# Patient Record
Sex: Male | Born: 1965 | Race: White | Hispanic: No | Marital: Married | Smoking: Former smoker
Health system: Southern US, Community
[De-identification: ages and names within clinical notes are randomized; demographics above are authoritative.]

## PROBLEM LIST (undated history)

## (undated) DIAGNOSIS — E785 Hyperlipidemia, unspecified: Secondary | ICD-10-CM

## (undated) DIAGNOSIS — I10 Essential (primary) hypertension: Secondary | ICD-10-CM

## (undated) DIAGNOSIS — K649 Unspecified hemorrhoids: Secondary | ICD-10-CM

## (undated) DIAGNOSIS — F419 Anxiety disorder, unspecified: Secondary | ICD-10-CM

## (undated) DIAGNOSIS — B49 Unspecified mycosis: Secondary | ICD-10-CM

## (undated) HISTORY — PX: LUNG SURGERY: SHX703

## (undated) HISTORY — DX: Essential (primary) hypertension: I10

## (undated) HISTORY — DX: Anxiety disorder, unspecified: F41.9

## (undated) HISTORY — DX: Unspecified hemorrhoids: K64.9

## (undated) HISTORY — DX: Hyperlipidemia, unspecified: E78.5

---

## 1998-11-01 ENCOUNTER — Emergency Department (HOSPITAL_COMMUNITY): Admission: EM | Admit: 1998-11-01 | Discharge: 1998-11-01 | Payer: Self-pay | Admitting: Emergency Medicine

## 1998-11-01 ENCOUNTER — Encounter: Payer: Self-pay | Admitting: Family Medicine

## 1998-11-01 ENCOUNTER — Ambulatory Visit (HOSPITAL_COMMUNITY): Admission: RE | Admit: 1998-11-01 | Discharge: 1998-11-01 | Payer: Self-pay | Admitting: Family Medicine

## 1998-11-20 ENCOUNTER — Ambulatory Visit (HOSPITAL_COMMUNITY): Admission: RE | Admit: 1998-11-20 | Discharge: 1998-11-20 | Payer: Self-pay | Admitting: Internal Medicine

## 1998-11-27 ENCOUNTER — Ambulatory Visit: Admission: RE | Admit: 1998-11-27 | Discharge: 1998-11-27 | Payer: Self-pay | Admitting: Internal Medicine

## 1998-12-27 ENCOUNTER — Ambulatory Visit (HOSPITAL_COMMUNITY): Admission: RE | Admit: 1998-12-27 | Discharge: 1998-12-27 | Payer: Self-pay | Admitting: Internal Medicine

## 1999-01-23 ENCOUNTER — Encounter: Payer: Self-pay | Admitting: Thoracic Surgery

## 1999-01-24 ENCOUNTER — Inpatient Hospital Stay (HOSPITAL_COMMUNITY): Admission: RE | Admit: 1999-01-24 | Discharge: 1999-01-28 | Payer: Self-pay | Admitting: Thoracic Surgery

## 1999-01-24 ENCOUNTER — Encounter: Payer: Self-pay | Admitting: Thoracic Surgery

## 1999-01-25 ENCOUNTER — Encounter: Payer: Self-pay | Admitting: Thoracic Surgery

## 1999-01-26 ENCOUNTER — Encounter: Payer: Self-pay | Admitting: Thoracic Surgery

## 1999-01-27 ENCOUNTER — Encounter: Payer: Self-pay | Admitting: Thoracic Surgery

## 2013-03-24 ENCOUNTER — Emergency Department (HOSPITAL_BASED_OUTPATIENT_CLINIC_OR_DEPARTMENT_OTHER)
Admission: EM | Admit: 2013-03-24 | Discharge: 2013-03-24 | Disposition: A | Discharge status: Home / Self Care | Payer: PRIVATE HEALTH INSURANCE | Attending: Emergency Medicine | Admitting: Emergency Medicine

## 2013-03-24 ENCOUNTER — Encounter (HOSPITAL_BASED_OUTPATIENT_CLINIC_OR_DEPARTMENT_OTHER): Payer: Self-pay | Admitting: *Deleted

## 2013-03-24 DIAGNOSIS — R42 Dizziness and giddiness: Secondary | ICD-10-CM | POA: Insufficient documentation

## 2013-03-24 LAB — CBC WITH DIFFERENTIAL/PLATELET
Basophils Absolute: 0.1 10*3/uL (ref 0.0–0.1)
Basophils Relative: 1 % (ref 0–1)
Eosinophils Absolute: 0.1 10*3/uL (ref 0.0–0.7)
Eosinophils Relative: 2 % (ref 0–5)
HCT: 43.5 % (ref 39.0–52.0)
Hemoglobin: 15.5 g/dL (ref 13.0–17.0)
Lymphocytes Relative: 43 % (ref 12–46)
Lymphs Abs: 2.9 10*3/uL (ref 0.7–4.0)
MCH: 28.7 pg (ref 26.0–34.0)
MCHC: 35.6 g/dL (ref 30.0–36.0)
MCV: 80.4 fL (ref 78.0–100.0)
Monocytes Absolute: 0.6 10*3/uL (ref 0.1–1.0)
Monocytes Relative: 9 % (ref 3–12)
Neutro Abs: 3.1 10*3/uL (ref 1.7–7.7)
Neutrophils Relative %: 46 % (ref 43–77)
Platelets: 208 10*3/uL (ref 150–400)
RBC: 5.41 MIL/uL (ref 4.22–5.81)
RDW: 13.6 % (ref 11.5–15.5)
WBC: 6.8 10*3/uL (ref 4.0–10.5)

## 2013-03-24 LAB — BASIC METABOLIC PANEL
BUN: 16 mg/dL (ref 6–23)
CO2: 28 mEq/L (ref 19–32)
Calcium: 9.5 mg/dL (ref 8.4–10.5)
Chloride: 100 mEq/L (ref 96–112)
Creatinine, Ser: 1.1 mg/dL (ref 0.50–1.35)
GFR calc Af Amer: >90 mL/min (ref >90)
GFR calc non Af Amer: 79 mL/min — ABNORMAL LOW (ref >90)
Glucose, Bld: 114 mg/dL — ABNORMAL HIGH (ref 70–99)
Potassium: 3.7 mEq/L (ref 3.5–5.1)
Sodium: 138 mEq/L (ref 135–145)

## 2013-03-24 LAB — TROPONIN I: Troponin I: <0.3 ng/mL (ref <0.30)

## 2013-03-24 NOTE — ED Notes (Signed)
Dizziness. Near syncope. States his face got red. Saw the nurse at work. His BP was 140. His blood sugar was 80. Same thing happened at lunch. The nurse told him to come here to be checked.

## 2013-03-24 NOTE — ED Notes (Signed)
D/c home with ride- emergency sx discussed with pt, as well as when to return to ed

## 2013-03-25 ENCOUNTER — Encounter (HOSPITAL_COMMUNITY): Payer: Self-pay | Admitting: Emergency Medicine

## 2013-03-25 ENCOUNTER — Emergency Department (HOSPITAL_COMMUNITY)
Admission: EM | Admit: 2013-03-25 | Discharge: 2013-03-26 | Disposition: A | Discharge status: Home / Self Care | Payer: PRIVATE HEALTH INSURANCE | Attending: Emergency Medicine | Admitting: Emergency Medicine

## 2013-03-25 DIAGNOSIS — R42 Dizziness and giddiness: Secondary | ICD-10-CM | POA: Insufficient documentation

## 2013-03-25 HISTORY — DX: Unspecified mycosis: B49

## 2013-03-25 NOTE — ED Provider Notes (Signed)
History    CSN: [id number] Arrival date & time 03/24/13  1706  First MD Initiated Contact with Patient 03/24/13 1742     Chief Complaint  Patient presents with  . Dizziness   (Consider location/radiation/quality/duration/timing/severity/associated sxs/prior Treatment) HPI  Jay Mueller is a 47 year old man who presents today complaining that he felt weak and lightheaded after lunch today. He did normal lunch was salmon and salad. He then began feeling somewhat lightheaded. This episode occurred approximately 1:30 and lasted for about 20 minutes. He had a second episode later in the afternoon. The nurse at his work checked his blood sugar and blood pressure. He is advised to come in for further evaluation. He denies any head pain, chest pain, or dyspnea. He has had no similar episodes in the past. He has not had any nausea, vomiting, or diarrhea. History reviewed. No pertinent past medical history. Past Surgical History  Procedure Laterality Date  . Lung surgery     No family history on file. History  Substance Use Topics  . Smoking status: Never Smoker   . Smokeless tobacco: Not on file  . Alcohol Use: Yes    Review of Systems  All other systems reviewed and are negative.    Allergies  Review of patient's allergies indicates no known allergies.  Home Medications  No current outpatient prescriptions on file. BP 146/96  Pulse 58  Temp(Src) 98.3 F (36.8 C) (Oral)  Resp 18  Ht 5\' 8"  (1.727 m)  Wt 236 lb (107.049 kg)  BMI 35.89 kg/m2  SpO2 97% Physical Exam  Nursing note and vitals reviewed. Constitutional: He is oriented to person, place, and time. He appears well-developed and well-nourished.  HENT:  Head: Normocephalic and atraumatic.  Right Ear: External ear normal.  Left Ear: External ear normal.  Nose: Nose normal.  Mouth/Throat: Oropharynx is clear and moist.  Eyes: Conjunctivae and EOM are normal. Pupils are equal, round, and reactive to light.  Neck:  Normal range of motion. Neck supple.  Cardiovascular: Normal rate, regular rhythm, normal heart sounds and intact distal pulses.   Pulmonary/Chest: Effort normal and breath sounds normal. No respiratory distress. He has no wheezes. He exhibits no tenderness.  Abdominal: Soft. Bowel sounds are normal. He exhibits no distension and no mass. There is no tenderness. There is no guarding.  Musculoskeletal: Normal range of motion.  Neurological: He is alert and oriented to person, place, and time. He has normal reflexes. He exhibits normal muscle tone. Coordination normal.  Skin: Skin is warm and dry.  Psychiatric: He has a normal Mueller and affect. His behavior is normal. Judgment and thought content normal.    ED Course  Procedures (including critical care time) Labs Reviewed  BASIC METABOLIC PANEL - Abnormal; Notable for the following:    Glucose, Bld 114 (*)    GFR calc non Af Amer 79 (*)    All other components within normal limits  CBC WITH DIFFERENTIAL  TROPONIN I   No results found. 1. Light headed     Date: 03/25/2013  Rate: 54  Rhythm: normal sinus rhythm  QRS Axis: left  Intervals: normal  ST/T Wave abnormalities: nonspecific ST/T changes  Conduction Disutrbances:none and left anterior fascicular block  Narrative Interpretation:   Old EKG Reviewed: none available Results for orders placed during the hospital encounter of 03/24/13  CBC WITH DIFFERENTIAL      Result Value Range   WBC 6.8  4.0 - 10.5 K/uL   RBC 5.41  4.22 -  5.81 MIL/uL   Hemoglobin 15.5  13.0 - 17.0 g/dL   HCT 19.1  47.8 - 29.5 %   MCV 80.4  78.0 - 100.0 fL   MCH 28.7  26.0 - 34.0 pg   MCHC 35.6  30.0 - 36.0 g/dL   RDW 62.1  30.8 - 65.7 %   Platelets 208  150 - 400 K/uL   Neutrophils Relative % 46  43 - 77 %   Neutro Abs 3.1  1.7 - 7.7 K/uL   Lymphocytes Relative 43  12 - 46 %   Lymphs Abs 2.9  0.7 - 4.0 K/uL   Monocytes Relative 9  3 - 12 %   Monocytes Absolute 0.6  0.1 - 1.0 K/uL   Eosinophils  Relative 2  0 - 5 %   Eosinophils Absolute 0.1  0.0 - 0.7 K/uL   Basophils Relative 1  0 - 1 %   Basophils Absolute 0.1  0.0 - 0.1 K/uL  BASIC METABOLIC PANEL      Result Value Range   Sodium 138  135 - 145 mEq/L   Potassium 3.7  3.5 - 5.1 mEq/L   Chloride 100  96 - 112 mEq/L   CO2 28  19 - 32 mEq/L   Glucose, Bld 114 (*) 70 - 99 mg/dL   BUN 16  6 - 23 mg/dL   Creatinine, Ser 8.46  0.50 - 1.35 mg/dL   Calcium 9.5  8.4 - 96.2 mg/dL   GFR calc non Af Amer 79 (*) >90 mL/min   GFR calc Af Amer >90  >90 mL/min  TROPONIN I      Result Value Range   Troponin I <0.30  <0.30 ng/mL     MDM  Patient presents with some intermittent episodes of lightheadedness. He has had no symptoms here. He denies any chest pain or other symptoms. 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 He is advised have close followup and return if he is worse at any time.  Hilario Quarry, MD 03/25/13 2229

## 2013-03-25 NOTE — ED Notes (Addendum)
Pt states that he was at [facility] last night and they DC'd him. Felt better today until about 1900 when he began to get shaky and diaphoretic and red in the face. Pt tried to rest and felt better and the symptoms returned around 2245. Pt in NAD at this time. Pt denies any blurred vision.

## 2013-03-26 ENCOUNTER — Emergency Department (HOSPITAL_COMMUNITY): Payer: PRIVATE HEALTH INSURANCE

## 2013-03-26 ENCOUNTER — Other Ambulatory Visit: Payer: Self-pay

## 2013-03-26 LAB — URINALYSIS, ROUTINE W REFLEX MICROSCOPIC
Hgb urine dipstick: NEGATIVE
Nitrite: NEGATIVE
Specific Gravity, Urine: 1.019 (ref 1.005–1.030)
Urobilinogen, UA: 0.2 mg/dL (ref 0.0–1.0)

## 2013-03-26 LAB — CBC WITH DIFFERENTIAL/PLATELET
Eosinophils Absolute: 0.1 10*3/uL (ref 0.0–0.7)
Lymphocytes Relative: 27 % (ref 12–46)
Lymphs Abs: 2.2 10*3/uL (ref 0.7–4.0)
MCH: 27.9 pg (ref 26.0–34.0)
Neutro Abs: 5.2 10*3/uL (ref 1.7–7.7)
Neutrophils Relative %: 66 % (ref 43–77)
Platelets: 202 10*3/uL (ref 150–400)
RBC: 5.44 MIL/uL (ref 4.22–5.81)
WBC: 8 10*3/uL (ref 4.0–10.5)

## 2013-03-26 LAB — BASIC METABOLIC PANEL
GFR calc non Af Amer: 82 mL/min — ABNORMAL LOW (ref >90)
Glucose, Bld: 120 mg/dL — ABNORMAL HIGH (ref 70–99)
Potassium: 3.9 mEq/L (ref 3.5–5.1)
Sodium: 136 mEq/L (ref 135–145)

## 2013-03-26 LAB — GLUCOSE, CAPILLARY: Glucose-Capillary: 103 mg/dL — ABNORMAL HIGH (ref 70–99)

## 2013-03-26 LAB — POCT I-STAT TROPONIN I: Troponin i, poc: 0.01 ng/mL (ref 0.00–0.08)

## 2013-03-26 MED ORDER — SODIUM CHLORIDE 0.9 % IV BOLUS (SEPSIS): 1000.0000 mL | Freq: Once | INTRAVENOUS | Status: DC

## 2013-03-26 MED ORDER — HYDROXYZINE HCL 25 MG PO TABS: 25.0000 mg | ORAL_TABLET | ORAL | Status: DC | PRN

## 2013-03-26 MED ORDER — LORAZEPAM 2 MG/ML IJ SOLN
0.5000 mg | Freq: Once | INTRAMUSCULAR | Status: AC
  Administered 2013-03-26: 0.5 mg via INTRAVENOUS
  Filled 2013-03-26: qty 1

## 2013-03-26 MED ORDER — SODIUM CHLORIDE 0.9 % IV BOLUS (SEPSIS)
1000.0000 mL | Freq: Once | INTRAVENOUS | Status: AC
  Administered 2013-03-26: 1000 mL via INTRAVENOUS

## 2013-03-26 MED ORDER — LORAZEPAM 2 MG/ML IJ SOLN
0.5000 mg | Freq: Once | INTRAMUSCULAR | Status: AC
Start: 2013-03-26 — End: 2013-03-26
  Administered 2013-03-26: 0.5 mg via INTRAVENOUS
  Filled 2013-03-26: qty 1

## 2013-03-26 NOTE — ED Provider Notes (Signed)
History    CSN: [id number] Arrival date & time 03/25/13  2326  First MD Initiated Contact with Patient 03/26/13 0034     Chief Complaint  Patient presents with  . Shaking  . Dizziness   (Consider location/radiation/quality/duration/timing/severity/associated sxs/prior Treatment) HPI  Jay Mueller is a 47 y.o. male complaining of shaking and sensation dizziness lightheadedness, denies vertiginous symptoms. He also feels flushed in his face and warm. He denies chills nausea vomiting chest pain shortness of breath, change in his vision, abdominal pain, change in bowel or bladder habits. Past medical history significant for left lobectomy secondary to fungal pneumonia. Patient states that she is extremely stressed and agitated about both family and work problems. No past medical history of panic or anxiety disorder. Patient states that when he starts thinking about his jitteriness the jitteriness gets worse.  Past Medical History  Diagnosis Date  . 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 Lung surgery      Left Lobectomy   History reviewed. No pertinent family history. History  Substance Use Topics  . Smoking status: Never Smoker   . Smokeless tobacco: Never Used  . Alcohol Use: Yes     Comment: occassionally    Review of Systems  Constitutional:       Negative except as described in HPI  HENT:       Negative except as described in HPI  Respiratory:       Negative except as described in HPI  Cardiovascular:       Negative except as described in HPI  Gastrointestinal:       Negative except as described in HPI  Genitourinary:       Negative except as described in HPI  Musculoskeletal:       Negative except as described in HPI  Skin:       Negative except as described in HPI  Neurological:       Negative except as described in HPI  All other systems reviewed and are negative.    Allergies  Review of patient's  allergies indicates no known allergies.  Home Medications   Current Outpatient Rx  Name  Route  Sig  Dispense  Refill  . ibuprofen (ADVIL,MOTRIN) 200 MG tablet   Oral   Take 200 mg by mouth every 6 (six) hours as needed for pain.          BP 162/104  Pulse 61  Temp(Src) 99.6 F (37.6 C) (Rectal)  Ht 5\' 9"  (1.753 m)  Wt 232 lb 4 oz (105.348 kg)  BMI 34.28 kg/m2  SpO2 98% Physical Exam  Nursing note and vitals reviewed. Constitutional: He is oriented to person, place, and time. He appears well-developed and well-nourished. No distress.  HENT:  Head: Normocephalic and atraumatic.  Mouth/Throat: Oropharynx is clear and moist.  Eyes: Conjunctivae and EOM are normal. Pupils are equal, round, and reactive to light.  Neck: Normal range of motion. Neck supple.  Patient has full range of motion to neck. No tenderness to deep palpation of the posterior cervical spine. Patient can flex chin to chest with no pain.  Cardiovascular: Normal rate, regular rhythm and intact distal pulses.   Pulmonary/Chest: Effort normal and breath sounds normal. No stridor. No respiratory distress. He has no wheezes. He has no rales. He exhibits no tenderness.  Abdominal: Soft. Bowel sounds are normal. He exhibits no distension and no mass. There is  no tenderness. There is no rebound and no guarding.  Musculoskeletal: Normal range of motion. He exhibits no edema.  Neurological: He is alert and oriented to person, place, and time.  Psychiatric: He has a normal mood and affect.    ED Course  Procedures (including critical care time) Labs Reviewed  BASIC METABOLIC PANEL - Abnormal; Notable for the following:    Glucose, Bld 120 (*)    GFR calc non Af Amer 82 (*)    All other components within normal limits  GLUCOSE, CAPILLARY - Abnormal; Notable for the following:    Glucose-Capillary 103 (*)    All other components within normal limits  CBC WITH DIFFERENTIAL  URINALYSIS, ROUTINE W REFLEX MICROSCOPIC   POCT I-STAT TROPONIN I   Dg Chest 2 View  03/26/2013   *RADIOLOGY REPORT*  Clinical Data: Fever, shakes, shortness of breath.  CHEST - 2 VIEW  Comparison: None.  Findings: Mild hyperinflation suggesting emphysema. The heart size and pulmonary vascularity are normal. The lungs appear clear and expanded without focal air space disease or consolidation. No blunting of the costophrenic angles.  No pneumothorax.  Mediastinal contours appear intact.  Degenerative changes in the spine.  IMPRESSION: No evidence of active pulmonary disease.   Original Report Authenticated By: Burman Nieves, M.D.    Date: 03/26/2013  Rate: 59  Rhythm: normal sinus rhythm  QRS Axis: left  Intervals: normal  ST/T Wave abnormalities: normal  Conduction Disutrbances:nonspecific intraventricular conduction delay  Narrative Interpretation:   Old EKG Reviewed: unchanged  1. Dizziness     MDM   Filed Vitals:   03/26/13 0110 03/26/13 0112 03/26/13 0214 03/26/13 0300  BP: 192/110 162/104  127/89  Pulse: 60 61  61  Temp:   99.6 F (37.6 C)   TempSrc:   Rectal   Resp:    17  Height:      Weight:      SpO2:    95%     Jay Mueller is a 47 y.o. male with tremor, sensation of feeling flushed and sweating worsening over 24 hours. Chest x-ray shows no abnormalities, blood work and vitals are normal. EKG is nonischemic and troponin (this is a second troponin is patient was seen earlier in the day) is negative. I have reassured the patient, he has improved after Ativan is given. Advised him to follow with primary care for thyroid workup.  Medications  sodium chloride 0.9 % bolus 1,000 mL (0 mLs Intravenous Stopped 03/26/13 0323)  LORazepam (ATIVAN) injection 0.5 mg (0.5 mg Intravenous Given 03/26/13 0222)  LORazepam (ATIVAN) injection 0.5 mg (0.5 mg Intravenous Given 03/26/13 0319)    Pt is hemodynamically stable, appropriate for, and amenable to discharge at this time. Pt verbalized understanding and agrees with care plan.  Outpatient follow-up and specific return precautions discussed.    Discharge Medication List as of 03/26/2013  3:03 AM    START taking these medications   Details  hydrOXYzine (ATARAX/VISTARIL) 25 MG tablet Take 1 tablet (25 mg total) by mouth every 4 (four) hours as needed for anxiety., Starting 03/26/2013, Until Discontinued, [employer], PA-C 03/27/13 1747

## 2013-03-26 NOTE — ED Notes (Signed)
Patient transported to X-ray 

## 2013-03-26 NOTE — ED Notes (Signed)
Pt c/o dizziness and feeling wobbly and these symptoms started on Thursday. Yesterday pt began shaking and that lasted for 45 minutes. Pt says he feels anxious at this time and shaky.

## 2013-03-30 NOTE — ED Provider Notes (Signed)
Medical screening examination/treatment/procedure(s) were performed by non-physician practitioner and as supervising physician I was immediately available for consultation/collaboration.  Jasmine Awe, MD 03/30/13 2325

## 2013-06-27 ENCOUNTER — Encounter: Payer: Self-pay | Admitting: Gastroenterology

## 2013-07-25 ENCOUNTER — Ambulatory Visit (INDEPENDENT_AMBULATORY_CARE_PROVIDER_SITE_OTHER): Payer: PRIVATE HEALTH INSURANCE | Admitting: Gastroenterology

## 2013-07-25 ENCOUNTER — Encounter: Payer: Self-pay | Admitting: Gastroenterology

## 2013-07-25 VITALS — BP 148/98 | HR 68 | Ht 69.29 in | Wt 228.0 lb

## 2013-07-25 DIAGNOSIS — K648 Other hemorrhoids: Secondary | ICD-10-CM | POA: Insufficient documentation

## 2013-07-25 MED ORDER — HYDROCORTISONE ACETATE 25 MG RE SUPP: 25.0000 mg | Freq: Two times a day (BID) | RECTAL | Status: AC

## 2013-07-25 NOTE — Assessment & Plan Note (Signed)
The patient has grade 2 hemorrhoids with persistent symptoms of itching and difficulty with hygiene  Recommendations #1 Anusol a.c. Suppositories #2 band ligation

## 2013-07-25 NOTE — Progress Notes (Signed)
History of Present Illness:  Pleasant 47 year old male referred for evaluation of rectal discomfort and bleeding.  Approximately 6 weeks ago he developed rectal discomfort and noted external hemorrhoids.  Since that time he's had intermittent minimal rectal bleeding consisting of bright  red blood on the toilet tissue.  He's had rectal itching.  With every bowel movement he notes prolapse of hemorrhoids that reduced spontaneously.  He moves his bowels regularly.  He is without abdominal pain.    Past Medical History  Diagnosis Date  . 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 Hemorrhoids   . HLD (hyperlipidemia)   . HTN (hypertension)   . Anxiety    Past Surgical History  Procedure Laterality Date  . Lung surgery Left     Left Lobectomy due to fungus   family history is not on file. He was adopted. Current Outpatient Prescriptions  Medication Sig Dispense Refill  . ibuprofen (ADVIL,MOTRIN) 200 MG tablet Take 200 mg by mouth every 6 (six) hours as needed for pain.       No current facility-administered medications for this visit.   Allergies as of 07/25/2013  . (No Known Allergies)    reports that he quit smoking about 17 years ago. His smoking use included Cigarettes. He smoked 0.00 packs per day. He has never used smokeless tobacco. He reports that he drinks alcohol. He reports that he does not use illicit drugs.     Review of Systems: Pertinent positive and negative review of systems were noted in the above HPI section. All other review of systems were otherwise negative.  Vital signs were reviewed in today's medical record Physical Exam: General: Well developed , well nourished, no acute distress Skin: anicteric Head: Normocephalic and atraumatic Eyes:  sclerae anicteric, EOMI Ears: Normal auditory acuity Mouth: No deformity or lesions Neck: Supple, no masses or thyromegaly Lungs: Clear throughout to auscultation Heart: Regular rate and rhythm; no murmurs, rubs or  bruits Abdomen: Soft, non tender and non distended. No masses, hepatosplenomegaly or hernias noted. Normal Bowel sounds Rectal: There are no external lesions Musculoskeletal: Symmetrical with no gross deformities  Skin: No lesions on visible extremities Pulses:  Normal pulses noted Extremities: No clubbing, cyanosis, edema or deformities noted Neurological: Alert oriented x 4, grossly nonfocal Cervical Nodes:  No significant cervical adenopathy Inguinal Nodes: No significant inguinal adenopathy Psychological:  Alert and cooperative. Normal mood and affect

## 2013-07-25 NOTE — Patient Instructions (Signed)
Your hemorrhoidal banding is scheduled on 08/10/2013 at 1:30pm

## 2013-07-28 ENCOUNTER — Other Ambulatory Visit: Payer: Self-pay

## 2013-08-10 ENCOUNTER — Encounter: Payer: PRIVATE HEALTH INSURANCE | Admitting: Gastroenterology

## 2016-07-31 ENCOUNTER — Ambulatory Visit: Payer: Self-pay | Admitting: Podiatry

## 2016-08-13 ENCOUNTER — Ambulatory Visit: Payer: Self-pay | Admitting: Podiatry

## 2016-10-01 ENCOUNTER — Ambulatory Visit: Payer: Self-pay | Admitting: Podiatry

## 2017-08-26 DIAGNOSIS — Z1211 Encounter for screening for malignant neoplasm of colon: Secondary | ICD-10-CM | POA: Diagnosis not present

## 2017-08-26 DIAGNOSIS — K219 Gastro-esophageal reflux disease without esophagitis: Secondary | ICD-10-CM | POA: Diagnosis not present

## 2017-10-19 DIAGNOSIS — G4700 Insomnia, unspecified: Secondary | ICD-10-CM | POA: Diagnosis not present

## 2017-10-19 DIAGNOSIS — R51 Headache: Secondary | ICD-10-CM | POA: Diagnosis not present

## 2018-04-01 DIAGNOSIS — Z630 Problems in relationship with spouse or partner: Secondary | ICD-10-CM | POA: Diagnosis not present

## 2018-04-05 DIAGNOSIS — N451 Epididymitis: Secondary | ICD-10-CM | POA: Diagnosis not present

## 2018-04-07 DIAGNOSIS — N451 Epididymitis: Secondary | ICD-10-CM | POA: Diagnosis not present

## 2018-04-07 DIAGNOSIS — R195 Other fecal abnormalities: Secondary | ICD-10-CM | POA: Diagnosis not present

## 2018-04-08 DIAGNOSIS — Z630 Problems in relationship with spouse or partner: Secondary | ICD-10-CM | POA: Diagnosis not present

## 2018-05-03 DIAGNOSIS — R300 Dysuria: Secondary | ICD-10-CM | POA: Diagnosis not present

## 2018-05-03 DIAGNOSIS — R1032 Left lower quadrant pain: Secondary | ICD-10-CM | POA: Diagnosis not present

## 2018-05-05 ENCOUNTER — Other Ambulatory Visit: Payer: Self-pay | Admitting: Adult Health Nurse Practitioner

## 2018-05-05 DIAGNOSIS — R1032 Left lower quadrant pain: Secondary | ICD-10-CM

## 2018-05-07 ENCOUNTER — Ambulatory Visit
Admission: RE | Admit: 2018-05-07 | Discharge: 2018-05-07 | Disposition: A | Discharge status: Home / Self Care | Payer: Commercial Managed Care - PPO | Source: Ambulatory Visit | Attending: Adult Health Nurse Practitioner | Admitting: Adult Health Nurse Practitioner

## 2018-05-07 DIAGNOSIS — R1900 Intra-abdominal and pelvic swelling, mass and lump, unspecified site: Secondary | ICD-10-CM | POA: Diagnosis not present

## 2018-05-07 DIAGNOSIS — R1032 Left lower quadrant pain: Secondary | ICD-10-CM

## 2018-06-22 ENCOUNTER — Ambulatory Visit: Payer: Self-pay | Admitting: Surgery

## 2018-06-22 DIAGNOSIS — K4090 Unilateral inguinal hernia, without obstruction or gangrene, not specified as recurrent: Secondary | ICD-10-CM | POA: Diagnosis not present

## 2018-06-22 NOTE — H&P (Signed)
Tanja Port Documented: 06/22/2018 3:17 PM Location: [facility] Patient #: [id number] DOB: 1966/08/26 Single / Language: [place] / Race: White Male  History of Present Illness Maisie Fus A. Ramiz Turpin MD; 06/22/2018 4:37 PM) Patient words: Patient sent at the request of Dr. Izola Price for a left inguinal hernia. He has had it for a number of months. The patient describes a dull aching pain. Sensation in his left groin that radiates into his testicle. It is made worse out of the urination sexual intercourse. He denies nausea vomiting. He denies dysuria. He notices a bulge in his left groin and this was verified by ultrasound obtained by Dr. Izola Price. He has no pain in his right groin. No change in bowel or bladder function.  The patient is a 52 year old male.   Diagnostic Studies History Maurilio Lovely; 06/22/2018 3:18 PM) Colonoscopy never  Allergies Maurilio Lovely; 06/22/2018 3:18 PM) No Known Drug Allergies [06/22/2018]: Allergies Reconciled  Medication History Maurilio Lovely; 06/22/2018 3:18 PM) Lisinopril (20MG  Tablet, Oral) Active. Medications Reconciled  Other Problems Maurilio Lovely; 06/22/2018 3:18 PM) High blood pressure     Review of Systems Maurilio Lovely; 06/22/2018 3:18 PM) Musculoskeletal Not Present- Back Pain, Joint Pain, Joint Stiffness, Muscle Pain, Muscle Weakness and Swelling of Extremities. Hematology Not Present- Blood Thinners, Easy Bruising, Excessive bleeding, Gland problems, HIV and Persistent Infections.  Vitals Maurilio Lovely; 06/22/2018 3:19 PM) 06/22/2018 3:18 PM Weight: 181.25 lb Height: 69in Body Surface Area: 1.98 m Body Mass Index: 26.77 kg/m  Temp.: 98.63F(Oral)  Pulse: 73 (Regular)  BP: 130/82 (Sitting, Left Arm, Standard)      Physical Exam (Shondale Quinley A. Kattia Selley MD; 06/22/2018 4:38 PM)  General Mental Status-Alert. General Appearance-Consistent with stated age. Hydration-Well hydrated. Voice-Normal.  Head  and Neck Head-normocephalic, atraumatic with no lesions or palpable masses. Trachea-midline.  Chest and Lung Exam Chest and lung exam reveals -quiet, even and easy respiratory effort with no use of accessory muscles and on auscultation, normal breath sounds, no adventitious sounds and normal vocal resonance. Inspection Chest Wall - Normal. Back - normal.  Cardiovascular Cardiovascular examination reveals -normal heart sounds, regular rate and rhythm with no murmurs and normal pedal pulses bilaterally.  Abdomen Note: Reducible left inguinal hernia. No evidence of right inguinal hernia.  Neurologic Neurologic evaluation reveals -alert and oriented x 3 with no impairment of recent or remote memory. Mental Status-Normal.  Musculoskeletal Normal Exam - Left-Upper Extremity Strength Normal and Lower Extremity Strength Normal. Normal Exam - Right-Upper Extremity Strength Normal, Lower Extremity Weakness.    Assessment & Plan (Aislynn Cifelli A. Pavel Gadd MD; 06/22/2018 4:39 PM)  LEFT INGUINAL HERNIA (K40.90) Impression: Discussed laparoscopic and open techniques with mesh. Risks, benefits and long-term expectations discussed. He will think things over and decide on timing depending on his travel work schedule. Symptoms of incarceration were discussed as well. The risk of hernia repair include bleeding, infection, organ injury, bowel injury, bladder injury, nerve injury recurrent hernia, blood clots, worsening of underlying condition, chronic pain, mesh use, open surgery, death, and the need for other operattions. Pt agrees to proceed  Current Plans Pt Education - Pamphlet Given - Hernia Surgery: discussed with patient and provided information. The anatomy & physiology of the abdominal wall and pelvic floor was discussed. The pathophysiology of hernias in the inguinal and pelvic region was discussed. Natural history risks such as progressive enlargement, pain, incarceration, and  strangulation was discussed. Contributors to complications such as smoking, obesity, diabetes, prior surgery, etc were discussed.  I feel the risks  of no intervention will lead to serious problems that outweigh the operative risks; therefore, I recommended surgery to reduce and repair the hernia. I explained laparoscopic techniques with possible need for an open approach. I noted usual use of mesh to patch and/or buttress hernia repair  Risks such as bleeding, infection, abscess, need for further treatment, heart attack, death, and other risks were discussed. I noted a good likelihood this will help address the problem. Goals of post-operative recovery were discussed as well. Possibility that this will not correct all symptoms was explained. I stressed the importance of low-impact activity, aggressive pain control, avoiding constipation, & not pushing through pain to minimize risk of post-operative chronic pain or injury. Possibility of reherniation was discussed. We will work to minimize complications.  An educational handout further explaining the pathology & treatment options was given as well. Questions were answered. The patient expresses understanding & wishes to proceed with surgery.

## 2018-06-22 NOTE — H&P (Signed)
Jay Mueller Documented: 06/22/2018 3:17 PM Location: [facility] Patient #: [id number] DOB: May 02, 1966 Single / Language: Jay Mueller / Race: White Male  History of Present Illness Jay Fus A. Lakela Kuba MD; 06/22/2018 4:37 PM) Patient words: Patient sent at the request of Dr. Izola Price for a left inguinal hernia. He has had it for a number of months. The patient describes a dull aching pain. Sensation in his left groin that radiates into his testicle. It is made worse out of the urination sexual intercourse. He denies nausea vomiting. He denies dysuria. He notices a bulge in his left groin and this was verified by ultrasound obtained by Dr. Izola Price. He has no pain in his right groin. No change in bowel or bladder function.  The patient is a 52 year old male.   Diagnostic Studies History Maurilio Lovely; 06/22/2018 3:18 PM) Colonoscopy never  Allergies Maurilio Lovely; 06/22/2018 3:18 PM) No Known Drug Allergies [06/22/2018]: Allergies Reconciled  Medication History Maurilio Lovely; 06/22/2018 3:18 PM) Lisinopril (20MG  Tablet, Oral) Active. Medications Reconciled  Other Problems Maurilio Lovely; 06/22/2018 3:18 PM) High blood pressure     Review of Systems Maurilio Lovely; 06/22/2018 3:18 PM) Musculoskeletal Not Present- Back Pain, Joint Pain, Joint Stiffness, Muscle Pain, Muscle Weakness and Swelling of Extremities. Hematology Not Present- Blood Thinners, Easy Bruising, Excessive bleeding, Gland problems, HIV and Persistent Infections.  Vitals Maurilio Lovely; 06/22/2018 3:19 PM) 06/22/2018 3:18 PM Weight: 181.25 lb Height: 69in Body Surface Area: 1.98 m Body Mass Index: 26.77 kg/m  Temp.: 98.1F(Oral)  Pulse: 73 (Regular)  BP: 130/82 (Sitting, Left Arm, Standard)      Physical Exam (Jay Mueller A. Pilar Corrales MD; 06/22/2018 4:38 PM)  General Mental Status-Alert. General Appearance-Consistent with stated age. Hydration-Well hydrated. Voice-Normal.  Head  and Neck Head-normocephalic, atraumatic with no lesions or palpable masses. Trachea-midline.  Chest and Lung Exam Chest and lung exam reveals -quiet, even and easy respiratory effort with no use of accessory muscles and on auscultation, normal breath sounds, no adventitious sounds and normal vocal resonance. Inspection Chest Wall - Normal. Back - normal.  Cardiovascular Cardiovascular examination reveals -normal heart sounds, regular rate and rhythm with no murmurs and normal pedal pulses bilaterally.  Abdomen Note: Reducible left inguinal hernia. No evidence of right inguinal hernia.  Neurologic Neurologic evaluation reveals -alert and oriented x 3 with no impairment of recent or remote memory. Mental Status-Normal.  Musculoskeletal Normal Exam - Left-Upper Extremity Strength Normal and Lower Extremity Strength Normal. Normal Exam - Right-Upper Extremity Strength Normal, Lower Extremity Weakness.    Assessment & Plan (Jay Stoney A. Sarahi Borland MD; 06/22/2018 4:41 PM)  LEFT INGUINAL HERNIA (K40.90) Impression: Discussed laparoscopic and open techniques with mesh. Risks, benefits and long-term expectations discussed. He will think things over and decide on timing depending on his travel work schedule. Symptoms of incarceration were discussed as well. The risk of hernia repair include bleeding, infection, organ injury, bowel injury, bladder injury, nerve injury recurrent hernia, blood clots, worsening of underlying condition, chronic pain, mesh use, open surgery, death, and the need for other operattions. Pt agrees to proceed  Current Plans Pt Education - Pamphlet Given - Hernia Surgery: discussed with patient and provided information. The anatomy & physiology of the abdominal wall and pelvic floor was discussed. The pathophysiology of hernias in the inguinal and pelvic region was discussed. Natural history risks such as progressive enlargement, pain, incarceration, and  strangulation was discussed. Contributors to complications such as smoking, obesity, diabetes, prior surgery, etc were discussed.  I feel the risks  of no intervention will lead to serious problems that outweigh the operative risks; therefore, I recommended surgery to reduce and repair the hernia. I explained laparoscopic techniques with possible need for an open approach. I noted usual use of mesh to patch and/or buttress hernia repair  Risks such as bleeding, infection, abscess, need for further treatment, heart attack, death, and other risks were discussed. I noted a good likelihood this will help address the problem. Goals of post-operative recovery were discussed as well. Possibility that this will not correct all symptoms was explained. I stressed the importance of low-impact activity, aggressive pain control, avoiding constipation, & not pushing through pain to minimize risk of post-operative chronic pain or injury. Possibility of reherniation was discussed. We will work to minimize complications.  An educational handout further explaining the pathology & treatment options was given as well. Questions were answered. The patient expresses understanding & wishes to proceed with surgery.  Pt Education - [facility] Mesh education: discussed with patient and provided information.

## 2019-08-02 IMAGING — US US PELVIS LIMITED
1 series · 11 of 11 positions shown · non-contrast
Comparison: None.

CLINICAL DATA: Left groin swelling and tenderness for 2 months.

EXAM:
LIMITED ULTRASOUND OF PELVIS
TECHNIQUE: Limited transabdominal ultrasound examination of the pelvis was
performed.

[Series 1: us pelvis limited · 0.09mm/px · 11 acquisitions, 11 frames shown]
[im 1/11]
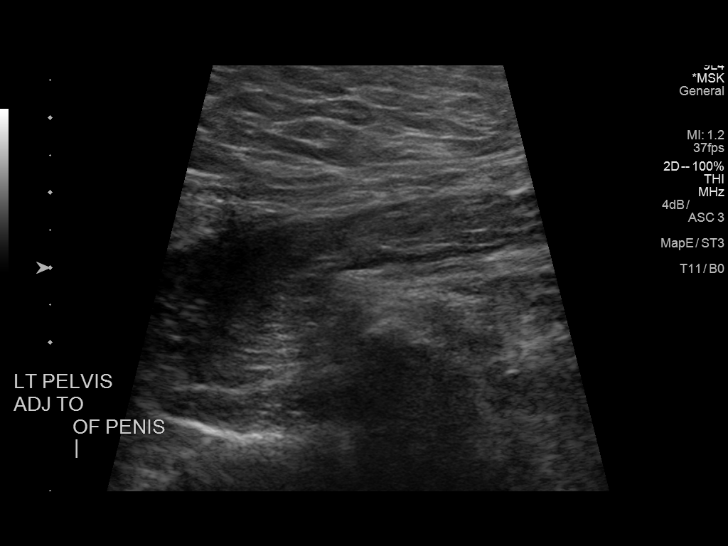
[im 2/11]
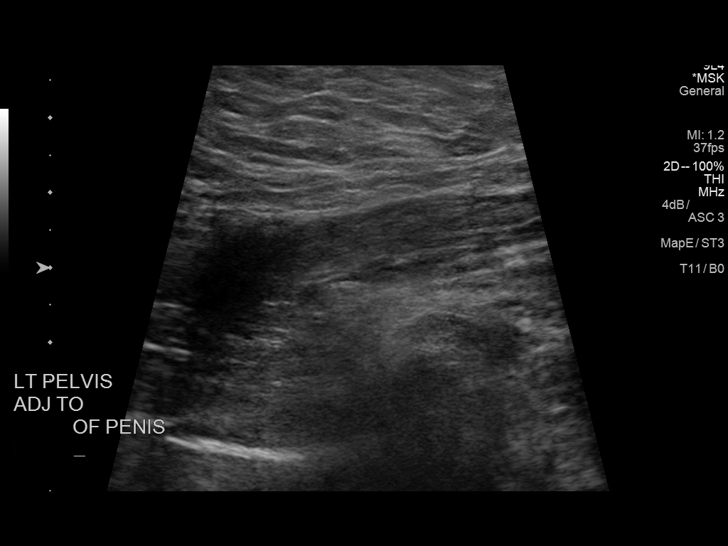
[im 3/11]
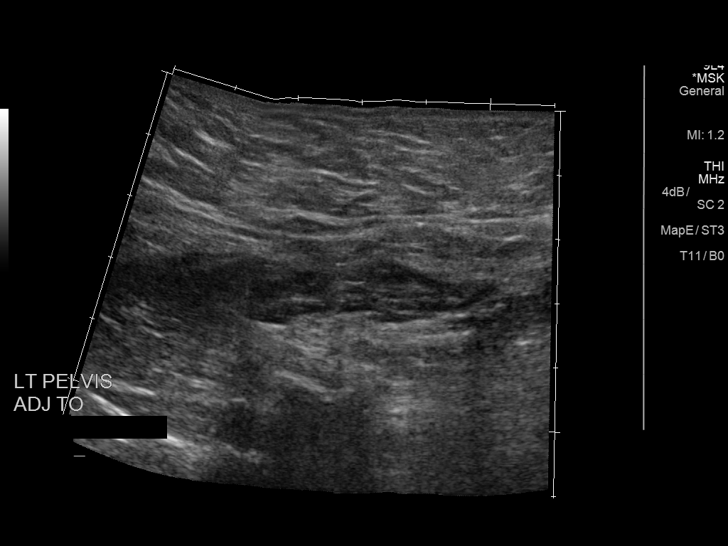
[im 4/11]
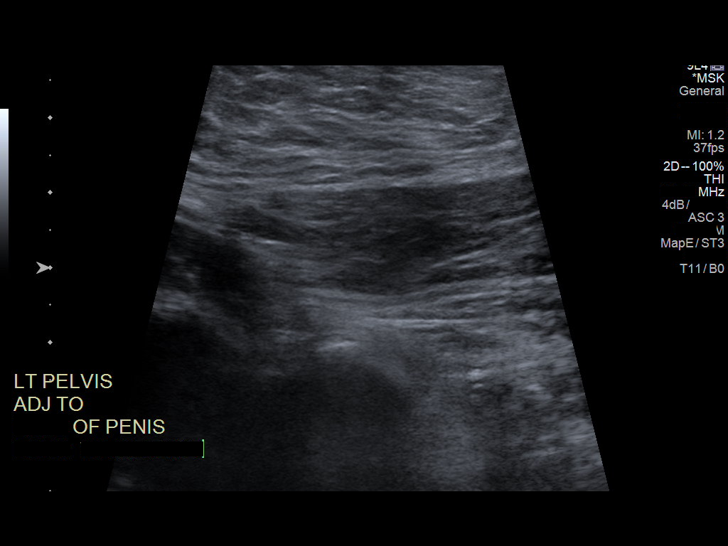
[im 5/11]
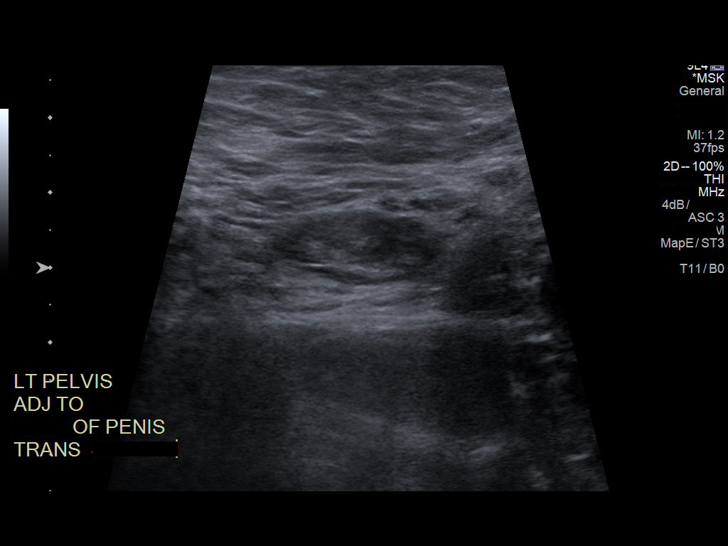
[im 6/11]
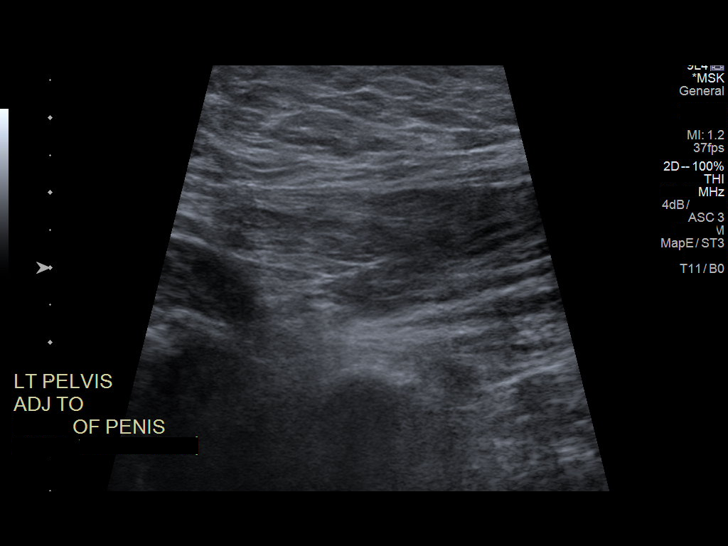
[im 7/11]
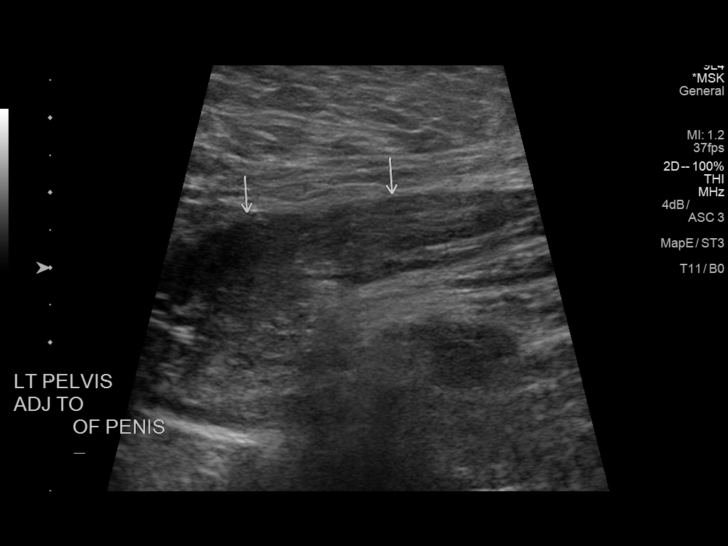
[im 8/11]
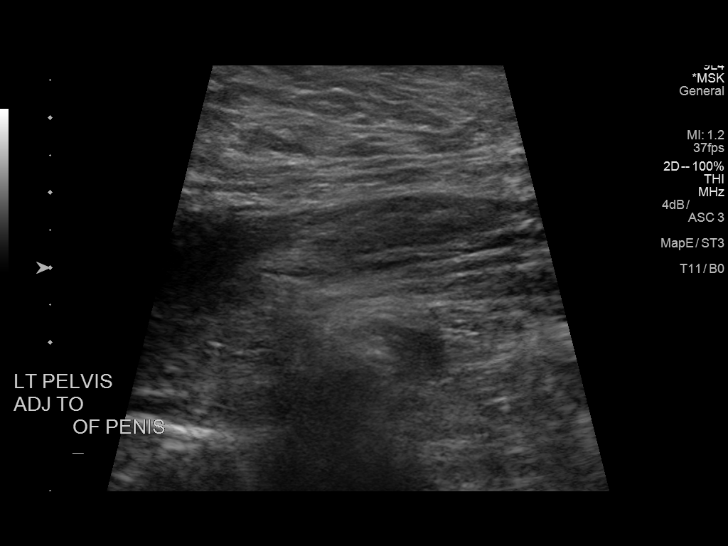
[im 9/11]
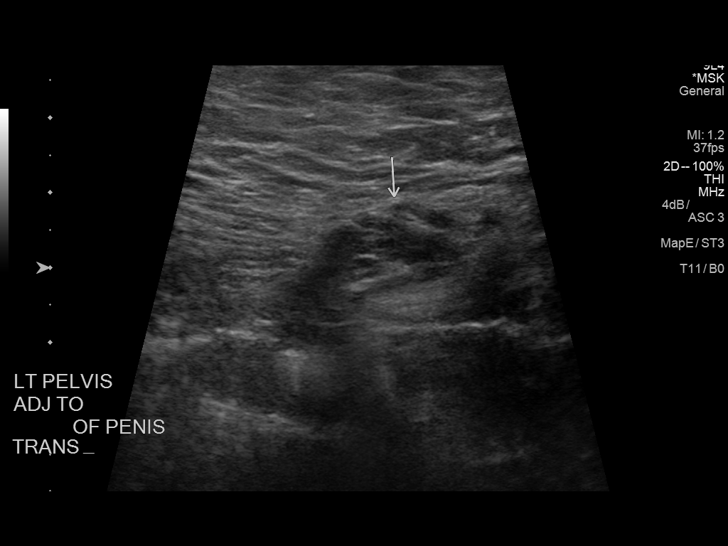
[im 10/11]
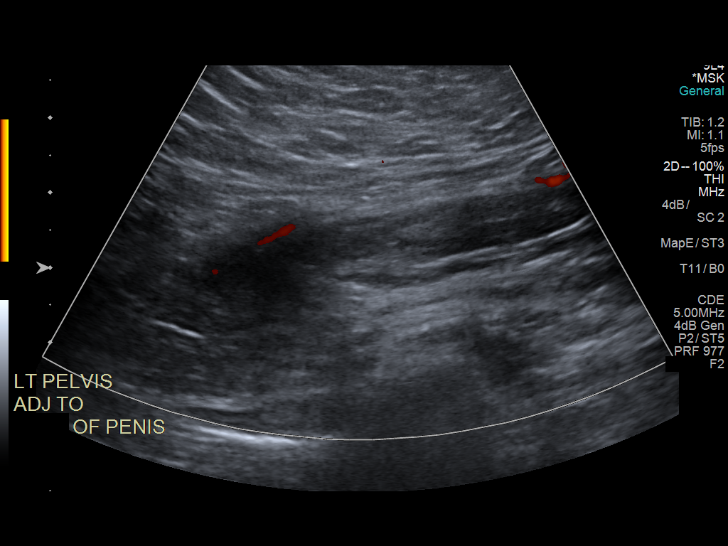
[im 11/11]
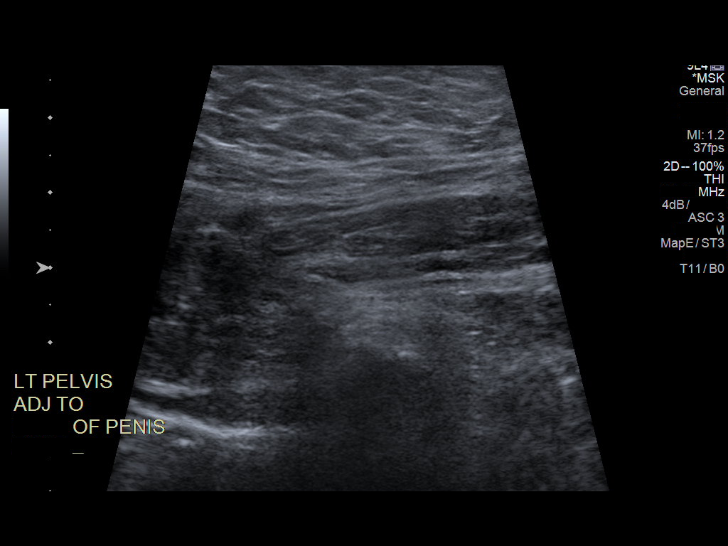

[11 of 11 positions shown; findings below may reference images not displayed]

FINDINGS: Sonographic evaluation of the left inguinal region demonstrates
hernia. No definite peristalsis is noted to suggest bowel.
IMPRESSION: Hernia is noted in area of palpable concern in left groin region.

## 2020-04-13 DIAGNOSIS — F4312 Post-traumatic stress disorder, chronic: Secondary | ICD-10-CM | POA: Diagnosis not present

## 2020-04-14 DIAGNOSIS — I10 Essential (primary) hypertension: Secondary | ICD-10-CM | POA: Diagnosis not present

## 2020-04-14 DIAGNOSIS — N529 Male erectile dysfunction, unspecified: Secondary | ICD-10-CM | POA: Diagnosis not present

## 2020-04-19 DIAGNOSIS — F4312 Post-traumatic stress disorder, chronic: Secondary | ICD-10-CM | POA: Diagnosis not present

## 2020-04-21 DIAGNOSIS — F4312 Post-traumatic stress disorder, chronic: Secondary | ICD-10-CM | POA: Diagnosis not present

## 2020-04-24 DIAGNOSIS — F4312 Post-traumatic stress disorder, chronic: Secondary | ICD-10-CM | POA: Diagnosis not present

## 2020-05-02 DIAGNOSIS — F4312 Post-traumatic stress disorder, chronic: Secondary | ICD-10-CM | POA: Diagnosis not present

## 2020-05-09 DIAGNOSIS — F4312 Post-traumatic stress disorder, chronic: Secondary | ICD-10-CM | POA: Diagnosis not present

## 2020-05-10 DIAGNOSIS — F4312 Post-traumatic stress disorder, chronic: Secondary | ICD-10-CM | POA: Diagnosis not present

## 2020-05-16 DIAGNOSIS — F4312 Post-traumatic stress disorder, chronic: Secondary | ICD-10-CM | POA: Diagnosis not present

## 2020-05-22 DIAGNOSIS — I10 Essential (primary) hypertension: Secondary | ICD-10-CM | POA: Diagnosis not present

## 2020-05-22 DIAGNOSIS — N529 Male erectile dysfunction, unspecified: Secondary | ICD-10-CM | POA: Diagnosis not present

## 2020-06-07 DIAGNOSIS — F4312 Post-traumatic stress disorder, chronic: Secondary | ICD-10-CM | POA: Diagnosis not present

## 2020-06-11 DIAGNOSIS — F4312 Post-traumatic stress disorder, chronic: Secondary | ICD-10-CM | POA: Diagnosis not present

## 2020-06-13 DIAGNOSIS — F4312 Post-traumatic stress disorder, chronic: Secondary | ICD-10-CM | POA: Diagnosis not present

## 2020-06-14 DIAGNOSIS — F4312 Post-traumatic stress disorder, chronic: Secondary | ICD-10-CM | POA: Diagnosis not present

## 2020-06-15 DIAGNOSIS — F4312 Post-traumatic stress disorder, chronic: Secondary | ICD-10-CM | POA: Diagnosis not present

## 2020-06-19 DIAGNOSIS — F4312 Post-traumatic stress disorder, chronic: Secondary | ICD-10-CM | POA: Diagnosis not present

## 2020-06-21 DIAGNOSIS — F4312 Post-traumatic stress disorder, chronic: Secondary | ICD-10-CM | POA: Diagnosis not present

## 2020-06-27 DIAGNOSIS — F4312 Post-traumatic stress disorder, chronic: Secondary | ICD-10-CM | POA: Diagnosis not present

## 2020-06-27 DIAGNOSIS — F4325 Adjustment disorder with mixed disturbance of emotions and conduct: Secondary | ICD-10-CM | POA: Diagnosis not present

## 2020-06-29 DIAGNOSIS — F4312 Post-traumatic stress disorder, chronic: Secondary | ICD-10-CM | POA: Diagnosis not present

## 2020-07-02 DIAGNOSIS — F4312 Post-traumatic stress disorder, chronic: Secondary | ICD-10-CM | POA: Diagnosis not present

## 2020-07-11 DIAGNOSIS — F4325 Adjustment disorder with mixed disturbance of emotions and conduct: Secondary | ICD-10-CM | POA: Diagnosis not present

## 2020-07-11 DIAGNOSIS — F4312 Post-traumatic stress disorder, chronic: Secondary | ICD-10-CM | POA: Diagnosis not present

## 2020-07-12 DIAGNOSIS — F4312 Post-traumatic stress disorder, chronic: Secondary | ICD-10-CM | POA: Diagnosis not present

## 2020-07-13 DIAGNOSIS — F4312 Post-traumatic stress disorder, chronic: Secondary | ICD-10-CM | POA: Diagnosis not present

## 2020-07-16 DIAGNOSIS — F4325 Adjustment disorder with mixed disturbance of emotions and conduct: Secondary | ICD-10-CM | POA: Diagnosis not present

## 2020-07-17 DIAGNOSIS — F4312 Post-traumatic stress disorder, chronic: Secondary | ICD-10-CM | POA: Diagnosis not present

## 2020-07-18 DIAGNOSIS — F4312 Post-traumatic stress disorder, chronic: Secondary | ICD-10-CM | POA: Diagnosis not present

## 2020-07-18 DIAGNOSIS — F4325 Adjustment disorder with mixed disturbance of emotions and conduct: Secondary | ICD-10-CM | POA: Diagnosis not present

## 2020-07-19 DIAGNOSIS — F4325 Adjustment disorder with mixed disturbance of emotions and conduct: Secondary | ICD-10-CM | POA: Diagnosis not present

## 2020-07-20 DIAGNOSIS — F4325 Adjustment disorder with mixed disturbance of emotions and conduct: Secondary | ICD-10-CM | POA: Diagnosis not present

## 2020-07-25 DIAGNOSIS — F4312 Post-traumatic stress disorder, chronic: Secondary | ICD-10-CM | POA: Diagnosis not present

## 2020-07-30 DIAGNOSIS — F4312 Post-traumatic stress disorder, chronic: Secondary | ICD-10-CM | POA: Diagnosis not present

## 2020-08-01 DIAGNOSIS — F4312 Post-traumatic stress disorder, chronic: Secondary | ICD-10-CM | POA: Diagnosis not present

## 2020-08-02 DIAGNOSIS — F4312 Post-traumatic stress disorder, chronic: Secondary | ICD-10-CM | POA: Diagnosis not present

## 2020-08-03 DIAGNOSIS — F4312 Post-traumatic stress disorder, chronic: Secondary | ICD-10-CM | POA: Diagnosis not present

## 2020-08-21 DIAGNOSIS — F4312 Post-traumatic stress disorder, chronic: Secondary | ICD-10-CM | POA: Diagnosis not present

## 2020-08-24 DIAGNOSIS — F4312 Post-traumatic stress disorder, chronic: Secondary | ICD-10-CM | POA: Diagnosis not present

## 2020-08-27 DIAGNOSIS — F4312 Post-traumatic stress disorder, chronic: Secondary | ICD-10-CM | POA: Diagnosis not present

## 2020-08-29 DIAGNOSIS — F4323 Adjustment disorder with mixed anxiety and depressed mood: Secondary | ICD-10-CM | POA: Diagnosis not present

## 2020-09-01 DIAGNOSIS — F4323 Adjustment disorder with mixed anxiety and depressed mood: Secondary | ICD-10-CM | POA: Diagnosis not present

## 2020-09-10 DIAGNOSIS — F4312 Post-traumatic stress disorder, chronic: Secondary | ICD-10-CM | POA: Diagnosis not present

## 2020-09-13 DIAGNOSIS — F4312 Post-traumatic stress disorder, chronic: Secondary | ICD-10-CM | POA: Diagnosis not present

## 2020-09-18 DIAGNOSIS — F4312 Post-traumatic stress disorder, chronic: Secondary | ICD-10-CM | POA: Diagnosis not present

## 2020-09-25 DIAGNOSIS — F4312 Post-traumatic stress disorder, chronic: Secondary | ICD-10-CM | POA: Diagnosis not present

## 2020-09-27 DIAGNOSIS — F4312 Post-traumatic stress disorder, chronic: Secondary | ICD-10-CM | POA: Diagnosis not present

## 2020-10-01 DIAGNOSIS — F4312 Post-traumatic stress disorder, chronic: Secondary | ICD-10-CM | POA: Diagnosis not present

## 2020-10-04 DIAGNOSIS — F4312 Post-traumatic stress disorder, chronic: Secondary | ICD-10-CM | POA: Diagnosis not present

## 2020-10-08 DIAGNOSIS — F4312 Post-traumatic stress disorder, chronic: Secondary | ICD-10-CM | POA: Diagnosis not present

## 2020-10-10 DIAGNOSIS — F4312 Post-traumatic stress disorder, chronic: Secondary | ICD-10-CM | POA: Diagnosis not present

## 2020-10-11 DIAGNOSIS — F4312 Post-traumatic stress disorder, chronic: Secondary | ICD-10-CM | POA: Diagnosis not present

## 2020-10-17 DIAGNOSIS — F4312 Post-traumatic stress disorder, chronic: Secondary | ICD-10-CM | POA: Diagnosis not present

## 2020-10-19 DIAGNOSIS — F4312 Post-traumatic stress disorder, chronic: Secondary | ICD-10-CM | POA: Diagnosis not present

## 2020-10-20 DIAGNOSIS — F4312 Post-traumatic stress disorder, chronic: Secondary | ICD-10-CM | POA: Diagnosis not present

## 2020-10-23 DIAGNOSIS — F4312 Post-traumatic stress disorder, chronic: Secondary | ICD-10-CM | POA: Diagnosis not present

## 2020-10-24 DIAGNOSIS — F4312 Post-traumatic stress disorder, chronic: Secondary | ICD-10-CM | POA: Diagnosis not present

## 2020-10-25 DIAGNOSIS — F4312 Post-traumatic stress disorder, chronic: Secondary | ICD-10-CM | POA: Diagnosis not present

## 2020-10-26 DIAGNOSIS — F4312 Post-traumatic stress disorder, chronic: Secondary | ICD-10-CM | POA: Diagnosis not present

## 2020-10-29 DIAGNOSIS — F4312 Post-traumatic stress disorder, chronic: Secondary | ICD-10-CM | POA: Diagnosis not present

## 2020-10-30 DIAGNOSIS — F4312 Post-traumatic stress disorder, chronic: Secondary | ICD-10-CM | POA: Diagnosis not present

## 2020-10-31 DIAGNOSIS — F4312 Post-traumatic stress disorder, chronic: Secondary | ICD-10-CM | POA: Diagnosis not present

## 2020-11-01 DIAGNOSIS — F4312 Post-traumatic stress disorder, chronic: Secondary | ICD-10-CM | POA: Diagnosis not present

## 2020-11-05 DIAGNOSIS — F4312 Post-traumatic stress disorder, chronic: Secondary | ICD-10-CM | POA: Diagnosis not present

## 2020-11-05 DIAGNOSIS — L989 Disorder of the skin and subcutaneous tissue, unspecified: Secondary | ICD-10-CM | POA: Diagnosis not present

## 2020-11-06 DIAGNOSIS — F4312 Post-traumatic stress disorder, chronic: Secondary | ICD-10-CM | POA: Diagnosis not present

## 2020-11-07 DIAGNOSIS — F4312 Post-traumatic stress disorder, chronic: Secondary | ICD-10-CM | POA: Diagnosis not present

## 2020-11-07 DIAGNOSIS — F312 Bipolar disorder, current episode manic severe with psychotic features: Secondary | ICD-10-CM | POA: Diagnosis not present

## 2020-11-08 DIAGNOSIS — F4312 Post-traumatic stress disorder, chronic: Secondary | ICD-10-CM | POA: Diagnosis not present

## 2020-11-08 DIAGNOSIS — F312 Bipolar disorder, current episode manic severe with psychotic features: Secondary | ICD-10-CM | POA: Diagnosis not present

## 2020-11-09 DIAGNOSIS — F4312 Post-traumatic stress disorder, chronic: Secondary | ICD-10-CM | POA: Diagnosis not present

## 2020-11-11 DIAGNOSIS — F4312 Post-traumatic stress disorder, chronic: Secondary | ICD-10-CM | POA: Diagnosis not present

## 2020-11-12 DIAGNOSIS — F4312 Post-traumatic stress disorder, chronic: Secondary | ICD-10-CM | POA: Diagnosis not present

## 2020-11-14 DIAGNOSIS — F4312 Post-traumatic stress disorder, chronic: Secondary | ICD-10-CM | POA: Diagnosis not present

## 2020-11-16 DIAGNOSIS — F4312 Post-traumatic stress disorder, chronic: Secondary | ICD-10-CM | POA: Diagnosis not present

## 2020-11-19 DIAGNOSIS — F4312 Post-traumatic stress disorder, chronic: Secondary | ICD-10-CM | POA: Diagnosis not present

## 2020-11-20 DIAGNOSIS — F4312 Post-traumatic stress disorder, chronic: Secondary | ICD-10-CM | POA: Diagnosis not present

## 2020-11-30 DIAGNOSIS — F4312 Post-traumatic stress disorder, chronic: Secondary | ICD-10-CM | POA: Diagnosis not present

## 2020-12-01 DIAGNOSIS — F4312 Post-traumatic stress disorder, chronic: Secondary | ICD-10-CM | POA: Diagnosis not present

## 2020-12-03 DIAGNOSIS — F4312 Post-traumatic stress disorder, chronic: Secondary | ICD-10-CM | POA: Diagnosis not present

## 2020-12-05 DIAGNOSIS — F4312 Post-traumatic stress disorder, chronic: Secondary | ICD-10-CM | POA: Diagnosis not present

## 2020-12-13 DIAGNOSIS — F4312 Post-traumatic stress disorder, chronic: Secondary | ICD-10-CM | POA: Diagnosis not present

## 2020-12-20 DIAGNOSIS — F4312 Post-traumatic stress disorder, chronic: Secondary | ICD-10-CM | POA: Diagnosis not present

## 2020-12-28 DIAGNOSIS — F4312 Post-traumatic stress disorder, chronic: Secondary | ICD-10-CM | POA: Diagnosis not present

## 2021-01-05 DIAGNOSIS — F4312 Post-traumatic stress disorder, chronic: Secondary | ICD-10-CM | POA: Diagnosis not present

## 2021-01-21 DIAGNOSIS — F4312 Post-traumatic stress disorder, chronic: Secondary | ICD-10-CM | POA: Diagnosis not present

## 2021-01-22 DIAGNOSIS — F4312 Post-traumatic stress disorder, chronic: Secondary | ICD-10-CM | POA: Diagnosis not present

## 2021-03-19 DIAGNOSIS — F4312 Post-traumatic stress disorder, chronic: Secondary | ICD-10-CM | POA: Diagnosis not present

## 2021-03-20 DIAGNOSIS — F4312 Post-traumatic stress disorder, chronic: Secondary | ICD-10-CM | POA: Diagnosis not present

## 2021-04-10 DIAGNOSIS — F4312 Post-traumatic stress disorder, chronic: Secondary | ICD-10-CM | POA: Diagnosis not present

## 2021-04-16 DIAGNOSIS — F4312 Post-traumatic stress disorder, chronic: Secondary | ICD-10-CM | POA: Diagnosis not present

## 2021-04-22 DIAGNOSIS — F4312 Post-traumatic stress disorder, chronic: Secondary | ICD-10-CM | POA: Diagnosis not present

## 2021-04-30 DIAGNOSIS — F4312 Post-traumatic stress disorder, chronic: Secondary | ICD-10-CM | POA: Diagnosis not present

## 2021-05-02 DIAGNOSIS — F4312 Post-traumatic stress disorder, chronic: Secondary | ICD-10-CM | POA: Diagnosis not present

## 2021-05-06 DIAGNOSIS — F4312 Post-traumatic stress disorder, chronic: Secondary | ICD-10-CM | POA: Diagnosis not present

## 2021-05-09 DIAGNOSIS — F4312 Post-traumatic stress disorder, chronic: Secondary | ICD-10-CM | POA: Diagnosis not present

## 2021-05-24 DIAGNOSIS — I10 Essential (primary) hypertension: Secondary | ICD-10-CM | POA: Diagnosis not present

## 2021-05-24 DIAGNOSIS — N529 Male erectile dysfunction, unspecified: Secondary | ICD-10-CM | POA: Diagnosis not present

## 2021-06-12 DIAGNOSIS — J029 Acute pharyngitis, unspecified: Secondary | ICD-10-CM | POA: Diagnosis not present

## 2022-04-14 DIAGNOSIS — I809 Phlebitis and thrombophlebitis of unspecified site: Secondary | ICD-10-CM | POA: Diagnosis not present

## 2022-07-07 DIAGNOSIS — E669 Obesity, unspecified: Secondary | ICD-10-CM | POA: Diagnosis not present

## 2022-07-07 DIAGNOSIS — I10 Essential (primary) hypertension: Secondary | ICD-10-CM | POA: Diagnosis not present

## 2022-07-07 DIAGNOSIS — N529 Male erectile dysfunction, unspecified: Secondary | ICD-10-CM | POA: Diagnosis not present

## 2022-10-22 DIAGNOSIS — E669 Obesity, unspecified: Secondary | ICD-10-CM | POA: Diagnosis not present

## 2022-10-22 DIAGNOSIS — I10 Essential (primary) hypertension: Secondary | ICD-10-CM | POA: Diagnosis not present

## 2024-02-19 DIAGNOSIS — Z6831 Body mass index (BMI) 31.0-31.9, adult: Secondary | ICD-10-CM | POA: Diagnosis not present

## 2024-02-19 DIAGNOSIS — N529 Male erectile dysfunction, unspecified: Secondary | ICD-10-CM | POA: Diagnosis not present

## 2024-02-19 DIAGNOSIS — Z13220 Encounter for screening for lipoid disorders: Secondary | ICD-10-CM | POA: Diagnosis not present

## 2024-02-19 DIAGNOSIS — I10 Essential (primary) hypertension: Secondary | ICD-10-CM | POA: Diagnosis not present
